# Patient Record
Sex: Female | Born: 1960 | Race: Black or African American | Hispanic: No | Marital: Married | Smoking: Never smoker
Health system: Southern US, Community
[De-identification: ages and names within clinical notes are randomized; demographics above are authoritative.]

## PROBLEM LIST (undated history)

## (undated) DIAGNOSIS — E7800 Pure hypercholesterolemia, unspecified: Secondary | ICD-10-CM

## (undated) DIAGNOSIS — H409 Unspecified glaucoma: Secondary | ICD-10-CM

## (undated) DIAGNOSIS — I10 Essential (primary) hypertension: Secondary | ICD-10-CM

## (undated) HISTORY — PX: ABDOMINAL HYSTERECTOMY: SHX81

## (undated) HISTORY — PX: BREAST REDUCTION SURGERY: SHX8

---

## 1997-09-18 ENCOUNTER — Other Ambulatory Visit: Admission: RE | Admit: 1997-09-18 | Discharge: 1997-09-18 | Payer: Self-pay | Admitting: *Deleted

## 1998-11-29 ENCOUNTER — Other Ambulatory Visit: Admission: RE | Admit: 1998-11-29 | Discharge: 1998-11-29 | Payer: Self-pay | Admitting: *Deleted

## 1999-12-24 ENCOUNTER — Other Ambulatory Visit: Admission: RE | Admit: 1999-12-24 | Discharge: 1999-12-24 | Payer: Self-pay | Admitting: *Deleted

## 2001-01-06 ENCOUNTER — Other Ambulatory Visit: Admission: RE | Admit: 2001-01-06 | Discharge: 2001-01-06 | Payer: Self-pay | Admitting: *Deleted

## 2002-01-17 ENCOUNTER — Other Ambulatory Visit: Admission: RE | Admit: 2002-01-17 | Discharge: 2002-01-17 | Payer: Self-pay | Admitting: *Deleted

## 2002-12-14 ENCOUNTER — Other Ambulatory Visit: Admission: RE | Admit: 2002-12-14 | Discharge: 2002-12-14 | Payer: Self-pay | Admitting: *Deleted

## 2005-12-21 ENCOUNTER — Inpatient Hospital Stay (HOSPITAL_COMMUNITY): Admission: AD | Admit: 2005-12-21 | Discharge: 2005-12-21 | Payer: Self-pay | Admitting: *Deleted

## 2006-01-18 ENCOUNTER — Inpatient Hospital Stay (HOSPITAL_COMMUNITY): Admission: AD | Admit: 2006-01-18 | Discharge: 2006-01-20 | Payer: Self-pay | Admitting: Obstetrics and Gynecology

## 2006-01-18 ENCOUNTER — Encounter (INDEPENDENT_AMBULATORY_CARE_PROVIDER_SITE_OTHER): Payer: Self-pay | Admitting: Specialist

## 2006-01-18 ENCOUNTER — Encounter (INDEPENDENT_AMBULATORY_CARE_PROVIDER_SITE_OTHER): Payer: Self-pay | Admitting: *Deleted

## 2009-10-07 ENCOUNTER — Emergency Department (HOSPITAL_BASED_OUTPATIENT_CLINIC_OR_DEPARTMENT_OTHER): Admission: EM | Admit: 2009-10-07 | Discharge: 2009-10-07 | Payer: Self-pay | Admitting: Emergency Medicine

## 2009-10-07 ENCOUNTER — Ambulatory Visit: Payer: Self-pay | Admitting: Radiology

## 2010-09-05 NOTE — H&P (Signed)
NAMEJENELLE, [name]            ACCOUNT NO.:  [id number]   MEDICAL RECORD NO.:  [id number]          PATIENT TYPE:  AMB   LOCATION:  SDC                           FACILITY:  [facility]   PHYSICIAN:  Pershing Cox, M.D.DATE OF BIRTH:  Sep 29, 1960   DATE OF ADMISSION:  01/18/2006  DATE OF DISCHARGE:                                HISTORY & PHYSICAL   ADMISSION DIAGNOSES:  1. Urinary retention.  2. Degenerating uterine myoma.  3. Abnormal ovary   HISTORY OF PRESENT ILLNESS:  [name] is a 50 year old married black female,  gravida 2, para 2, with a long history of myomatous uterus.  We have  followed her for some time with sonograms because her ovaries are impossible  to discern on pelvic examination because of her uterine myomas.  Recently  she has begun to have difficulty with urination, especially having  difficulty starting a urinary stream.  She was seen in our office for  urinary tract infection in late July, and at that time I told her that her  uterus could be a problem with urination and that if she had difficulty to  present to the emergency room.  In August, she actually presented to the  emergency room at [facility] because she was unable to void.  At  that time she had a CT scan which showed myomas which were suggested to be  degenerating.  She was seen later in our office and taught to self  catheterize, and a decision was made to plan a surgical excision.  She has  been planning that excision, but most recently called [place] and said that she  needed to accelerate that plan because she was now having a really difficult  time catheterizing herself and emptying her bladder.  She was seen today in  the office by Dr. Maxie Better, who is going to be the admitting  surgeon for this operation, and examination was performed.  She had a  sonogram performed in August which showed an abnormal ovary.  The sonogram  was suggestive of abnormal blood flow to this solid  ovarian mass measuring  about 4 x 2 cm.  Today this mass seems smaller, and the blood flow was not  so remarkable.  It is our plan, however, to remove this ovarian mass at the  time of this surgical procedure.  The patient's uterine myomas are  approximately 12 cm in size.  The volume of the uterus is about 600 mL.   PAST MEDICAL HISTORY:  Allergy to PENICILLIN.   SERIOUS MEDICAL ILLNESSES:  1. History of heart murmur.  2. Status post tubal ligation.  3. Status post breast reduction.  4. Status post cesarean section.   SOCIAL HISTORY:  The patient is a nonsmoker.  She is married.  She does not  exercise but does supplement her calcium.   FAMILY HISTORY:  The patient's father died at 42 of renal failure.  He was  having dialysis.  He had a history of heart valve disease and hypertension.  The patient's cousin has a history of breast cancer.   PREGNANCY:  The patient  is gravida 2, para 2.  She has had a tubal ligation.   REVIEW OF SYSTEMS:  Other than urinary retention, there is no specific  complaint.  She has had no specific weight gain, fever.  She has no problem  with breast pain.  Gastrointestinal symptoms are not present.   PHYSICAL EXAMINATION:  VITAL SIGNS:  Blood pressure 112/84, pulse 78.  Height 5 feet 5 inches.  Weight is not recorded.  HEENT: Normocephalic, anicteric, EOMI, PERRL.  GENERAL: The patient is well-nourished and well-developed.  She is a normal  posture, normal gait, normal hydration.  SKIN: No noted skin lesions.  There are skin scars over the site of her  cesarean section and also over her breast from her breast reduction.  LUNGS: The lungs are clear.  CARDIOVASCULAR: Exam shows a harsh cardiac murmur.  ABDOMEN: Shows a palpation of the fibroids rising out of the pelvis.  Liver  and spleen are normal to palpation.  PELVIC:  Normal external genitalia.  Urethra is displaced anteriorly.  The  speculum shows the cervix to be up high behind the symphysis.   The uterus is  markedly retroflexed, and this is probably what is causing the urinary  retention.  Adnexa cannot be assessed on bimanual examination. Rectovaginal  examination shows a markedly retroverted uterus but no evidence of cul-de-  sac nodularity.   ASSESSMENT:  1. Degenerating uterine myoma causing marked retroflexion of the uterus      and urinary retention.  2. Abnormal ovary. Rule out ovarian malignancy.  3. Cardiac murmur.   PLAN:  And the patient will receive ampicillin and gentamicin for antibiotic  prophylaxis.  She will undergo bowel prep in preparation for this surgery.  Exploratory laparotomy will be performed.  Frozen section will be performed  from the abnormal ovary, and hysterectomy will be completed.  If ovarian  cancer is found, a staging procedure will be completed.      Pershing Cox, M.D.  Electronically Signed     MAJ/MEDQ  D:  01/15/2006  T:  01/17/2006  Job:  [id number]

## 2010-09-05 NOTE — Op Note (Signed)
[name], LEBON            ACCOUNT NO.:  [id number]   MEDICAL RECORD NO.:  [id number]          PATIENT TYPE:  INP   LOCATION:  9318                          FACILITY:  [facility]   PHYSICIAN:  Sheronette Cousins, M.D.DATE OF BIRTH:  1961-04-04   DATE OF PROCEDURE:  01/18/2006  DATE OF DISCHARGE:                                 OPERATIVE REPORT   PREOPERATIVE DIAGNOSIS:  Urinary retention, retroverted fibroid uterus,  right complex ovarian cyst.   PROCEDURE:  Exploratory laparotomy, peritoneal washings, total abdominal  hysterectomy, right salpingo-oophorectomy with frozen section.   POSTOPERATIVE DIAGNOSIS:  Hemorrhagic right ovarian cyst, urinary retention,  retroverted fibroid uterus.   ANESTHESIA:  General.   SURGEON:  Maxie Better, M.D.   ASSISTANT:  Pershing Cox, M.D.   DESCRIPTION OF PROCEDURE:  Under adequate general anesthesia, the patient  was placed in the supine position.  Examination under anesthesia revealed a  14-weeks size large fibroid retroverted uterus. The patient was sterilely  prepped and draped in the usual fashion.  An indwelling Foley catheter was  sterilely placed.  10 mL of 0.25% Marcaine was injected along the previous  Pfannenstiel skin incision.  The Pfannenstiel skin incision was then made,  carried down to the rectus fascia.  The rectus fascia was opened  transversely.  The rectus fascia was then bluntly and sharply dissected off  the rectus muscle in a superior and inferior fashion.  The rectus muscle was  already partially separated.  The parietal peritoneum was opened sharply and  extended. On entering the abdominal cavity, it was then noted that the  omentum was adherent focally on the anterior abdominal wall.  Careful  dissection resulted in extension of the parietal peritoneum superiorly. 500  mL of normal saline was instilled in the abdomen, 400 mL was suctioned for  peritoneal washings. The parietal peritoneum was then  further extended.  The  omental adhesions were then taken down carefully. The upper abdomen was  explored, a normal liver edge was palpable, normal kidneys.  The appendix  was noted to be normal, there is fat at the end of the appendix. The uterus  was noted to have a large posterior fibroid, about 8 cm, which resulted in  the uterus being severely retroverted.  The left ovary and tube appeared  normal.  The right ovary had about a 2.5 cm mass that appeared to be blood  filled that was on the surface of the ovary. The right tube was normal.  Prior evidence of tubal ligation was noted bilaterally. The right round  ligament was then suture ligated x2, the intervening segment was opened  using cautery.  The anterior cul-de-sac was then opened transversely with  the bladder reflection being displaced inferiorly.  The right  retroperitoneal space was then opened. The retroperitoneal dissection was  performed.  The right ureter was noted to be visibly peristalsing and  normal.  The right ovarian vessels/infundibulopelvic ligament was doubly  clamped, cut, free tied proximally with the 0 Vicryl x2. The right utero-  ovarian ligament was then clamped and the right tube and ovary were then  severed from  their attachment and sent off for frozen section.  Attention  was returned to the uterus.  The uterus was lifted out of the pelvis and  exteriorized.  The left broad ligament was doubly suture ligated and the  intervening segment opened with cautery.  The anterior leaf of the broad  ligament was opened to further open the anterior cul-de-sac and the  vesicouterine peritoneum. Some adhesions on the left anterior abdominal wall  at the lower level of the lower uterine segment on the left was then lysed.  The posterior leaf of the broad ligament on the left was then opened. The  left utero-ovarian ligament was then doubly clamped, cut, and free tied with  0 Vicryl and suture ligated.  There was a  bleeder that opened up prior to  the ovarian ligament being clamped and cut, and this was carefully clamped  and suture ligated. The uterine vessels were then bilaterally skeletonized,  doubly clamped, cut and suture ligated on the right. On the left, was  clamped, cut, suture ligated and clamped distally, cut and suture ligated  with 0 Vicryl.  The bladder was then further sharply dissected off lower  uterine segment to facilitate the surgery.  The fundus of the uterus was  truncated off from the cervical portion, the bowels were then popped  upwardly, the self-retaining Balfour retractor was then placed. The  truncated cervix was then grasped. The cardinal ligaments was then  bilaterally clamped, cut, suture ligated with 0 Vicryl.  The was no  definition of the uterosacral ligaments noted posteriorly.  They were not  well developed. The cervicovaginal junction was ultimately reached. The  clamp was placed across the cervicovaginal junction on the left, it was then  cut, at which time the cervix was noted to have been severed from its  attachment.  Right angle scissors was then used to circumferentially cut the  cervix from its vaginal attachments. The right vaginal cuff, angled figure-  of-eight suture was then placed. The cervix was then reefed with 0 Vicryl  taking care not to involve the bladder anteriorly and then the cervix was  closed with interrupted 0 Vicryl figure-of-eight sutures. There was a  bleeding site on the right angle which was then hemostasis 3-0 Monocryl  suture. Also, there was bleeding from the peritoneal edges of the left ovary  the retroperitoneal space on the left which was then clamped and free tied  with 3-0 Monocryl suture.  There was a bleeder near the left ovarian pedicle  which was carefully clamped and again suture ligated with 0 Vicryl. The left  ureter was noted to be deep in the pelvis. The abdomen was then copiously irrigated, suctioned, reinspected for  bleeding, and good hemostasis was  noted. The left ovary and tube were not suspended to the left broad  ligament. The packings were removed.  The self-retaining retractor was  removed.  The abdomen was again irrigated and suctioned.  Good hemostasis  noted. At that point, the parietal peritoneum was not closed.  The rectus  fascia was closed with 0 Vicryl x2 after the under surface was inspected for  bleeders.  The subcuticular area was irrigated, small bleeders cauterized,  and 2-0 plain interrupted sutures were placed and the skin approximated  using Ethicon staples.  The specimen was peritoneal washings sent for  cytology, uterus with cervix, right fallopian tube and ovary.  Estimated  blood loss was 150 mL, urine output was 725 mL clear yellow urine,  intraoperative  fluid was 2 liters.  Sponge and instrument counts x2 was  correct.  Complication were none.  Frozen section revealed a hemorrhagic  cyst.  The patient tolerated the procedure well and was transferred to the  recovery room in stable condition.      Maxie Better, M.D.  Electronically Signed     [place]/MEDQ  D:  01/18/2006  T:  01/19/2006  Job:  [id number]

## 2010-09-05 NOTE — H&P (Signed)
[name], [name]            ACCOUNT NO.:  [id number]   MEDICAL RECORD NO.:  [id number]          PATIENT TYPE:  MAT   LOCATION:  MATC                          FACILITY:  [facility]   PHYSICIAN:  [place] B. Earlene Plater, M.D.  DATE OF BIRTH:  1960-08-22   DATE OF ADMISSION:  12/21/2005  DATE OF DISCHARGE:                                HISTORY & PHYSICAL   CHIEF COMPLAINT:  Urinary retention.   HISTORY OF PRESENT ILLNESS:  A 50 year old African-American female, gravida  2, para 2 with history of uterine fibroids and difficulty voiding. She  states most mornings when she arises she has extreme difficulty voiding.  Today it was severe. She was completely unable to void. Typically as she  goes through the day this improves, but at night it is an issue once again.  She has history of fibroids and plans for hysterectomy in the near future  with Dr. Carey Bullocks.   PAST MEDICAL HISTORY:  Heart murmur.   PAST SURGICAL HISTORY:  C-section, laparoscopy for Fallopian tube cyst,  breast reduction.   REVIEW OF SYSTEMS:  Otherwise, negative.   MEDICATIONS:  None.   ALLERGIES:  None.   PAST OBSTETRICAL HISTORY:  Gravida 2, para 2, previous C-section.   PHYSICAL EXAMINATION:  VITAL SIGNS:  Temperature 97.9, pulse 100,  respirations 20, blood pressure 172/110.  GENERAL:  The patient is alert and oriented and in no acute distress, moving  extremities well. NEUROLOGICAL:  Nonfocal.  ABDOMEN:  No mass palpable, liver and spleen are normal.  PELVIC:  Normal external genitalia. Vagina normal. Cervix is touching  against the mid urethra just behind the pubic symphysis. Uterus is  retroverted. Does feel enlarged with history of fibroids and adnexal masses.   ASSESSMENT:  Intermittent urinary retention apparently due to mechanical  obstruction from mild prolapse and uterine fibroids.   PLAN:  The patient will be instructed in intermittent self cath, and urine  culture will be sent today. The patient is  instructed to follow up in the  office as previously directed for preop visit.      [name] B. Earlene Plater, M.D.  Electronically Signed     WBD/MEDQ  D:  12/21/2005  T:  12/21/2005  Job:  [id number]

## 2011-07-22 ENCOUNTER — Emergency Department (HOSPITAL_BASED_OUTPATIENT_CLINIC_OR_DEPARTMENT_OTHER)
Admission: EM | Admit: 2011-07-22 | Discharge: 2011-07-22 | Disposition: A | Discharge status: Home / Self Care | Payer: BC Managed Care – PPO | Attending: Emergency Medicine | Admitting: Emergency Medicine

## 2011-07-22 ENCOUNTER — Encounter (HOSPITAL_BASED_OUTPATIENT_CLINIC_OR_DEPARTMENT_OTHER): Payer: Self-pay | Admitting: *Deleted

## 2011-07-22 DIAGNOSIS — J069 Acute upper respiratory infection, unspecified: Secondary | ICD-10-CM | POA: Insufficient documentation

## 2011-07-22 DIAGNOSIS — Z880 Allergy status to penicillin: Secondary | ICD-10-CM | POA: Insufficient documentation

## 2011-07-22 DIAGNOSIS — I10 Essential (primary) hypertension: Secondary | ICD-10-CM | POA: Insufficient documentation

## 2011-07-22 HISTORY — DX: Essential (primary) hypertension: I10

## 2011-07-22 MED ORDER — HYDROCODONE-HOMATROPINE 5-1.5 MG/5ML PO SYRP: 5.0000 mL | ORAL_SOLUTION | Freq: Three times a day (TID) | ORAL | Status: AC | PRN

## 2011-07-22 NOTE — ED Provider Notes (Signed)
History     CSN: [id number]  Arrival date & time 07/22/11  1421   First MD Initiated Contact with Patient 07/22/11 1454      Chief Complaint  Patient presents with  . URI  . Headache     Patient is a 51 y.o. female presenting with URI and headaches. The history is provided by the patient.  URI The primary symptoms include fatigue, headaches, cough, nausea and myalgias. Primary symptoms do not include ear pain, sore throat, abdominal pain or vomiting. The current episode started 2 days ago. This is a new problem. The problem has been gradually worsening.  The onset of the illness is associated with exposure to sick contacts.  Headache  Associated symptoms include nausea. Pertinent negatives include no vomiting.  Pt reports cough/congestion for past 2 days She also reports myalgias No recorded fever No SOB/CP/Syncope No vomiting/diarrhea She reports she has HA mostly with coughing Reports HA improved at this time  Past Medical History  Diagnosis Date  . Hypertension     Past Surgical History  Procedure Date  . Abdominal hysterectomy   . Cesarean section     History reviewed. No pertinent family history.  History  Substance Use Topics  . Smoking status: Never Smoker   . Smokeless tobacco: Not on file  . Alcohol Use: No    OB History    Grav Para Term Preterm Abortions TAB SAB Ect Mult Living                  Review of Systems  Constitutional: Positive for fatigue.  HENT: Negative for ear pain and sore throat.   Respiratory: Positive for cough.   Gastrointestinal: Positive for nausea. Negative for vomiting and abdominal pain.  Musculoskeletal: Positive for myalgias.  Neurological: Positive for headaches.    Allergies  Penicillins  Home Medications   Current Outpatient Rx  Name Route Sig Dispense Refill  . HYDROCHLOROTHIAZIDE 25 MG PO TABS Oral Take 25 mg by mouth daily.    Marland Kitchen HYDROCODONE-HOMATROPINE 5-1.5 MG/5ML PO SYRP Oral Take 5 mLs by mouth every  8 (eight) hours as needed for cough. 120 mL 0    BP 188/109  Pulse 101  Temp(Src) 99.4 F (37.4 C) (Oral)  Resp 20  Ht 5\' 6"  (1.676 m)  Wt 175 lb (79.379 kg)  BMI 28.25 kg/m2  SpO2 100%  Physical Exam CONSTITUTIONAL: Well developed/well nourished HEAD AND FACE: Normocephalic/atraumatic EYES: EOMI/PERRL ENMT: Mucous membranes moist, nasal congestion NECK: supple no meningeal signs SPINE:entire spine nontender CV: murmur noted (chronic per patient) LUNGS: Lungs are clear to auscultation bilaterally, no apparent distress ABDOMEN: soft, nontender, no rebound or guarding GU:no cva tenderness NEURO: Pt is awake/alert, moves all extremitiesx4 EXTREMITIES: pulses normal, full ROM SKIN: warm, color normal PSYCH: no abnormalities of mood noted  ED Course  Procedures     1. Hypertension   2. URI (upper respiratory infection)    Pt well appearing watching TV She has cough/congestion but lung sounds clear Pt has murmur but she reports this is chronic per patient and it is not harsh/loud in nature  The patient appears reasonably screened and/or stabilized for discharge and I doubt any other medical condition or other [facility] requiring further screening, evaluation, or treatment in the ED at this time prior to discharge.    MDM  Nursing notes reviewed and considered in documentation         Joya Gaskins, MD 07/22/11 1520

## 2011-07-22 NOTE — Discharge Instructions (Signed)
Antibiotic Nonuse  Your caregiver felt that the infection or problem was not one that would be helped with an antibiotic. Infections may be caused by viruses or bacteria. Only a caregiver can tell which one of these is the likely cause of an illness. A cold is the most common cause of infection in both adults and children. A cold is a virus. Antibiotic treatment will have no effect on a viral infection. Viruses can lead to many lost days of work caring for sick children and many missed days of school. Children may catch as many as 10 "colds" or "flus" per year during which they can be tearful, cranky, and uncomfortable. The goal of treating a virus is aimed at keeping the ill person comfortable. Antibiotics are medications used to help the body fight bacterial infections. There are relatively few types of bacteria that cause infections but there are hundreds of viruses. While both viruses and bacteria cause infection they are very different types of germs. A viral infection will typically go away by itself within 7 to 10 days. Bacterial infections may spread or get worse without antibiotic treatment. Examples of bacterial infections are:  Sore throats (like strep throat or tonsillitis).   Infection in the lung (pneumonia).   Ear and skin infections.  Examples of viral infections are:  Colds or flus.   Most coughs and bronchitis.   Sore throats not caused by Strep.   Runny noses.  It is often best not to take an antibiotic when a viral infection is the cause of the problem. Antibiotics can kill off the helpful bacteria that we have inside our body and allow harmful bacteria to start growing. Antibiotics can cause side effects such as allergies, nausea, and diarrhea without helping to improve the symptoms of the viral infection. Additionally, repeated uses of antibiotics can cause bacteria inside of our body to become resistant. That resistance can be passed onto harmful bacterial. The next time  you have an infection it may be harder to treat if antibiotics are used when they are not needed. Not treating with antibiotics allows our own immune system to develop and take care of infections more efficiently. Also, antibiotics will work better for us when they are prescribed for bacterial infections. Treatments for a child that is ill may include:  Give extra fluids throughout the day to stay hydrated.   Get plenty of rest.   Only give your child over-the-counter or prescription medicines for pain, discomfort, or fever as directed by your caregiver.   The use of a cool mist humidifier may help stuffy noses.   Cold medications if suggested by your caregiver.  Your caregiver may decide to start you on an antibiotic if:  The problem you were seen for today continues for a longer length of time than expected.   You develop a secondary bacterial infection.  SEEK MEDICAL CARE IF:  Fever lasts longer than 5 days.   Symptoms continue to get worse after 5 to 7 days or become severe.   Difficulty in breathing develops.   Signs of dehydration develop (poor drinking, rare urinating, dark colored urine).   Changes in behavior or worsening tiredness (listlessness or lethargy).  Document Released: 06/15/2001 Document Revised: 03/26/2011 Document Reviewed: 12/12/2008 Exit[employer] Patient Information 2012 [employer].Arterial Hypertension Arterial hypertension (high blood pressure) is a condition of elevated pressure in your blood vessels. Hypertension over a long period of time is a risk factor for strokes, heart attacks, and heart failure. It is also   the leading cause of kidney (renal) failure.  CAUSES   In Adults -- Over 90% of all hypertension has no known cause. This is called essential or primary hypertension. In the other 10% of people with hypertension, the increase in blood pressure is caused by another disorder. This is called secondary hypertension. Important causes of secondary  hypertension are:   Heavy alcohol use.   Obstructive sleep apnea.   Hyperaldosterosim (Conn's syndrome).   Steroid use.   Chronic kidney failure.   Hyperparathyroidism.   Medications.   Renal artery stenosis.   Pheochromocytoma.   Cushing's disease.   Coarctation of the aorta.   Scleroderma renal crisis.   Licorice (in excessive amounts).   Drugs (cocaine, methamphetamine).  Your caregiver can explain any items above that apply to you.  In Children -- Secondary hypertension is more common and should always be considered.   Pregnancy -- Few women of childbearing age have high blood pressure. However, up to 10% of them develop hypertension of pregnancy. Generally, this will not harm the woman. It may be a sign of 3 complications of pregnancy: preeclampsia, HELLP syndrome, and eclampsia. Follow up and control with medication is necessary.  SYMPTOMS   This condition normally does not produce any noticeable symptoms. It is usually found during a routine exam.   Malignant hypertension is a late problem of high blood pressure. It may have the following symptoms:   Headaches.   Blurred vision.   End-organ damage (this means your kidneys, heart, lungs, and other organs are being damaged).   Stressful situations can increase the blood pressure. If a person with normal blood pressure has their blood pressure go up while being seen by their caregiver, this is often termed "white coat hypertension." Its importance is not known. It may be related with eventually developing hypertension or complications of hypertension.   Hypertension is often confused with mental tension, stress, and anxiety.  DIAGNOSIS  The diagnosis is made by 3 separate blood pressure measurements. They are taken at least 1 week apart from each other. If there is organ damage from hypertension, the diagnosis may be made without repeat measurements. Hypertension is usually identified by having blood pressure  readings:  Above 140/90 mmHg measured in both arms, at 3 separate times, over a couple weeks.   Over 130/80 mmHg should be considered a risk factor and may require treatment in patients with diabetes.  Blood pressure readings over 120/80 mmHg are called "pre-hypertension" even in non-diabetic patients. To get a true blood pressure measurement, use the following guidelines. Be aware of the factors that can alter blood pressure readings.  Take measurements at least 1 hour after caffeine.   Take measurements 30 minutes after smoking and without any stress. This is another reason to quit smoking - it raises your blood pressure.   Use a proper cuff size. Ask your caregiver if you are not sure about your cuff size.   Most home blood pressure cuffs are automatic. They will measure systolic and diastolic pressures. The systolic pressure is the pressure reading at the start of sounds. Diastolic pressure is the pressure at which the sounds disappear. If you are elderly, measure pressures in multiple postures. Try sitting, lying or standing.   Sit at rest for a minimum of 5 minutes before taking measurements.   You should not be on any medications like decongestants. These are found in many cold medications.   Record your blood pressure readings and review them with your caregiver.    If you have hypertension:  Your caregiver may do tests to be sure you do not have secondary hypertension (see "causes" above).   Your caregiver may also look for signs of metabolic syndrome. This is also called Syndrome X or Insulin Resistance Syndrome. You may have this syndrome if you have type 2 diabetes, abdominal obesity, and abnormal blood lipids in addition to hypertension.   Your caregiver will take your medical and family history and perform a physical exam.   Diagnostic tests may include blood tests (for glucose, cholesterol, potassium, and kidney function), a urinalysis, or an EKG. Other tests may also be  necessary depending on your condition.  PREVENTION  There are important lifestyle issues that you can adopt to reduce your chance of developing hypertension:  Maintain a normal weight.   Limit the amount of salt (sodium) in your diet.   Exercise often.   Limit alcohol intake.   Get enough potassium in your diet. Discuss specific advice with your caregiver.   Follow a DASH diet (dietary approaches to stop hypertension). This diet is rich in fruits, vegetables, and low-fat dairy products, and avoids certain fats.  PROGNOSIS  Essential hypertension cannot be cured. Lifestyle changes and medical treatment can lower blood pressure and reduce complications. The prognosis of secondary hypertension depends on the underlying cause. Many people whose hypertension is controlled with medicine or lifestyle changes can live a normal, healthy life.  RISKS AND COMPLICATIONS  While high blood pressure alone is not an illness, it often requires treatment due to its short- and long-term effects on many organs. Hypertension increases your risk for:  CVAs or strokes (cerebrovascular accident).   Heart failure due to chronically high blood pressure (hypertensive cardiomyopathy).   Heart attack (myocardial infarction).   Damage to the retina (hypertensive retinopathy).   Kidney failure (hypertensive nephropathy).  Your caregiver can explain list items above that apply to you. Treatment of hypertension can significantly reduce the risk of complications. TREATMENT   For overweight patients, weight loss and regular exercise are recommended. Physical fitness lowers blood pressure.   Mild hypertension is usually treated with diet and exercise. A diet rich in fruits and vegetables, fat-free dairy products, and foods low in fat and salt (sodium) can help lower blood pressure. Decreasing salt intake decreases blood pressure in a 1/3 of people.   Stop smoking if you are a smoker.  The steps above are highly  effective in reducing blood pressure. While these actions are easy to suggest, they are difficult to achieve. Most patients with moderate or severe hypertension end up requiring medications to bring their blood pressure down to a normal level. There are several classes of medications for treatment. Blood pressure pills (antihypertensives) will lower blood pressure by their different actions. Lowering the blood pressure by 10 mmHg may decrease the risk of complications by as much as 25%. The goal of treatment is effective blood pressure control. This will reduce your risk for complications. Your caregiver will help you determine the best treatment for you according to your lifestyle. What is excellent treatment for one person, may not be for you. HOME CARE INSTRUCTIONS   Do not smoke.   Follow the lifestyle changes outlined in the "Prevention" section.   If you are on medications, follow the directions carefully. Blood pressure medications must be taken as prescribed. Skipping doses reduces their benefit. It also puts you at risk for problems.   Follow up with your caregiver, as directed.   If you are asked   to monitor your blood pressure at home, follow the guidelines in the "Diagnosis" section above.  SEEK MEDICAL CARE IF:   You think you are having medication side effects.   You have recurrent headaches or lightheadedness.   You have swelling in your ankles.   You have trouble with your vision.  SEEK IMMEDIATE MEDICAL CARE IF:   You have sudden onset of chest pain or pressure, difficulty breathing, or other symptoms of a heart attack.   You have a severe headache.   You have symptoms of a stroke (such as sudden weakness, difficulty speaking, difficulty walking).  MAKE SURE YOU:   Understand these instructions.   Will watch your condition.   Will get help right away if you are not doing well or get worse.  Document Released: 04/06/2005 Document Revised: 03/26/2011 Document  Reviewed: 11/04/2006 Exit[employer] Patient Information 2012 [employer]. 

## 2011-07-22 NOTE — ED Notes (Signed)
Pt states cold symptoms x 2 days

## 2011-07-22 NOTE — ED Notes (Signed)
Pt c/o of headache since Monday- states "it comes and goes"

## 2012-04-18 ENCOUNTER — Emergency Department (HOSPITAL_BASED_OUTPATIENT_CLINIC_OR_DEPARTMENT_OTHER): Payer: BC Managed Care – PPO

## 2012-04-18 ENCOUNTER — Emergency Department (HOSPITAL_BASED_OUTPATIENT_CLINIC_OR_DEPARTMENT_OTHER)
Admission: EM | Admit: 2012-04-18 | Discharge: 2012-04-18 | Disposition: A | Discharge status: Home / Self Care | Payer: BC Managed Care – PPO | Attending: Emergency Medicine | Admitting: Emergency Medicine

## 2012-04-18 ENCOUNTER — Encounter (HOSPITAL_BASED_OUTPATIENT_CLINIC_OR_DEPARTMENT_OTHER): Payer: Self-pay

## 2012-04-18 DIAGNOSIS — M25462 Effusion, left knee: Secondary | ICD-10-CM

## 2012-04-18 DIAGNOSIS — M25469 Effusion, unspecified knee: Secondary | ICD-10-CM | POA: Insufficient documentation

## 2012-04-18 DIAGNOSIS — I10 Essential (primary) hypertension: Secondary | ICD-10-CM | POA: Insufficient documentation

## 2012-04-18 MED ORDER — IBUPROFEN 400 MG PO TABS: 400.0000 mg | ORAL_TABLET | Freq: Four times a day (QID) | ORAL | Status: DC | PRN

## 2012-04-18 NOTE — ED Notes (Signed)
C/o swelling, pain to left knee and calf since 12/20-denies injury

## 2012-04-18 NOTE — ED Notes (Signed)
Pt sts she has crutches at home so she refused crutches here.

## 2012-04-18 NOTE — ED Provider Notes (Signed)
History  This chart was scribed for Tobin Chad, MD by Manuela Schwartz, ED scribe. This patient was seen in room MH11/MH11 and the patient's care was started at 1903.   CSN: [id number]  Arrival date & time 04/18/12  1903   First MD Initiated Contact with Patient 04/18/12 2141      Chief Complaint  Patient presents with  . Leg Pain   Patient is a 51 y.o. female presenting with leg pain. The history is provided by the patient. No language interpreter was used.  Leg Pain  The incident occurred more than 1 week ago. The incident occurred at home. There was no injury mechanism. The pain is present in the left knee and left leg. The quality of the pain is described as aching. The pain is mild. The pain has been constant since onset. Pertinent negatives include no numbness and no tingling. She reports no foreign bodies present. The symptoms are aggravated by bearing weight. She has tried nothing for the symptoms.   [name] is a 51 y.o. female who presents to the Emergency Department complaining of constant gradually worsening left knee/leg swelling/pain since 12/20. She denies any recent trauma or previous problems with her left knee. 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 She denies fever, rash, dysuria, discharge, rash, recent travels. She denies hx of gout.   Past Medical History  Diagnosis Date  . Hypertension     Past Surgical History  Procedure Date  . Abdominal hysterectomy   . Cesarean section     No family history on file.  History  Substance Use Topics  . Smoking status: Never Smoker   . Smokeless tobacco: Not on file  . Alcohol Use: No    OB History    Grav Para Term Preterm Abortions TAB SAB Ect Mult Living                  Review of Systems  Constitutional: Negative for fever and chills.  Respiratory: Negative for shortness of breath.   Gastrointestinal: Negative for nausea and vomiting.  Musculoskeletal: Positive for joint  swelling (left knee joint swelling with mild pain).  Neurological: Negative for tingling, weakness and numbness.  All other systems reviewed and are negative.    Allergies  Penicillins  Home Medications   Current Outpatient Rx  Name  Route  Sig  Dispense  Refill  . HYDROCHLOROTHIAZIDE 25 MG PO TABS   Oral   Take 25 mg by mouth daily.           Triage Vitals: BP 188/95  Pulse 82  Temp 98.6 F (37 C) (Oral)  Resp 16  SpO2 100%  Physical Exam  Nursing note and vitals reviewed. Constitutional: She is oriented to person, place, and time. She appears well-developed and well-nourished. No distress.  HENT:  Head: Normocephalic and atraumatic.  Right Ear: External ear normal.  Left Ear: External ear normal.  Nose: Nose normal.  Mouth/Throat: No oropharyngeal exudate.  Eyes: Conjunctivae normal and EOM are normal. Pupils are equal, round, and reactive to light. Right eye exhibits no discharge. Left eye exhibits no discharge. No scleral icterus.  Neck: Neck supple. No JVD present. No tracheal deviation present.  Cardiovascular: Normal rate, regular rhythm, normal heart sounds and intact distal pulses.  Exam reveals no gallop and no friction rub.   No murmur heard. Pulmonary/Chest: Effort normal and breath sounds normal. No stridor. No respiratory distress. She has no wheezes. She has  no rales. She exhibits no tenderness.  Abdominal: Soft. She exhibits no distension and no mass. There is no tenderness. There is no rebound and no guarding.  Musculoskeletal: Normal range of motion. She exhibits tenderness. She exhibits no edema.       Right knee: Normal.       Left knee: She exhibits swelling and effusion. She exhibits normal range of motion, no ecchymosis, no deformity, no laceration, no erythema, normal alignment, no LCL laxity, normal patellar mobility, no bony tenderness, normal meniscus and no MCL laxity. tenderness found. Medial joint line and MCL tenderness noted. No lateral  joint line, no LCL and no patellar tendon tenderness noted.       Negative anterior and posterior drawer  Neurological: She is alert and oriented to person, place, and time. No cranial nerve deficit.  Skin: Skin is warm and dry. No rash noted. No erythema. No pallor.  Psychiatric: She has a normal mood and affect. Her behavior is normal.    ED Course  Procedures (including critical care time) DIAGNOSTIC STUDIES: Oxygen Saturation is 100% on room air, normal by my interpretation.     Labs Reviewed - No data to display No results found.   No diagnosis found.    MDM  Pt presents with atraumatic left knee swelling.  She appears nontoxic, note elevated BP, NAD.  She has no risk factors for a septic arthropathy and is able to tolerate complete ROM with minimal discomfort.  She has tenderness with palpation along the medical joint line.  There is no instability of the joint.  She is not exquisitely tender to suggest an acute gouty arthropathy.  Will place in a knee immobilizer to be used for comfort.  Will start a short course of an antiinflammatory and instruct close follow-up with an orthopedic specialist.    I personally performed the services described in this documentation, which was scribed in my presence. The recorded information has been reviewed and is accurate.           Tobin Chad, MD 04/18/12 [phone]

## 2015-01-17 ENCOUNTER — Emergency Department (HOSPITAL_BASED_OUTPATIENT_CLINIC_OR_DEPARTMENT_OTHER): Payer: Worker's Compensation

## 2015-01-17 ENCOUNTER — Emergency Department (HOSPITAL_BASED_OUTPATIENT_CLINIC_OR_DEPARTMENT_OTHER)
Admission: EM | Admit: 2015-01-17 | Discharge: 2015-01-17 | Disposition: A | Discharge status: Home / Self Care | Payer: Worker's Compensation | Attending: Emergency Medicine | Admitting: Emergency Medicine

## 2015-01-17 ENCOUNTER — Encounter (HOSPITAL_BASED_OUTPATIENT_CLINIC_OR_DEPARTMENT_OTHER): Payer: Self-pay

## 2015-01-17 DIAGNOSIS — Y9389 Activity, other specified: Secondary | ICD-10-CM | POA: Insufficient documentation

## 2015-01-17 DIAGNOSIS — Y9289 Other specified places as the place of occurrence of the external cause: Secondary | ICD-10-CM | POA: Insufficient documentation

## 2015-01-17 DIAGNOSIS — S60221A Contusion of right hand, initial encounter: Secondary | ICD-10-CM | POA: Diagnosis not present

## 2015-01-17 DIAGNOSIS — S6991XA Unspecified injury of right wrist, hand and finger(s), initial encounter: Secondary | ICD-10-CM | POA: Diagnosis present

## 2015-01-17 DIAGNOSIS — E780 Pure hypercholesterolemia: Secondary | ICD-10-CM | POA: Insufficient documentation

## 2015-01-17 DIAGNOSIS — Y990 Civilian activity done for income or pay: Secondary | ICD-10-CM | POA: Diagnosis not present

## 2015-01-17 DIAGNOSIS — Z79899 Other long term (current) drug therapy: Secondary | ICD-10-CM | POA: Diagnosis not present

## 2015-01-17 DIAGNOSIS — W231XXA Caught, crushed, jammed, or pinched between stationary objects, initial encounter: Secondary | ICD-10-CM | POA: Diagnosis not present

## 2015-01-17 DIAGNOSIS — I10 Essential (primary) hypertension: Secondary | ICD-10-CM | POA: Insufficient documentation

## 2015-01-17 DIAGNOSIS — Z880 Allergy status to penicillin: Secondary | ICD-10-CM | POA: Insufficient documentation

## 2015-01-17 HISTORY — DX: Pure hypercholesterolemia, unspecified: E78.00

## 2015-01-17 NOTE — ED Provider Notes (Signed)
CSN: [id number]     Arrival date & time 01/17/15  1412 History   First MD Initiated Contact with Patient 01/17/15 1427     Chief Complaint  Patient presents with  . Hand Injury     (Consider location/radiation/quality/duration/timing/severity/associated sxs/prior Treatment) HPI Comments: Patient presents with complaint of right hand injury. Patient states that yesterday she closed her right hand in a door at work. She had pain and noted worsening swelling this morning to the back of her hand. She has applied ice. No treatments prior to arrival. Patient went to work today without any difficulty. She was instructed to come to the emergency department for evaluation by her job. No other complaints. Onset of symptoms acute. Course is constant. Nothing makes symptoms better or worse.  Patient is a 54 y.o. female presenting with hand injury. The history is provided by the patient.  Hand Injury Associated symptoms: no back pain and no neck pain     Past Medical History  Diagnosis Date  . Hypertension   . High cholesterol    Past Surgical History  Procedure Laterality Date  . Abdominal hysterectomy    . Cesarean section     No family history on file. Social History  Substance Use Topics  . Smoking status: Never Smoker   . Smokeless tobacco: None  . Alcohol Use: No   OB History    No data available     Review of Systems  Constitutional: Negative for activity change.  Musculoskeletal: Positive for joint swelling and arthralgias. Negative for back pain and neck pain.  Skin: Negative for wound.  Neurological: Negative for weakness and numbness.      Allergies  Penicillins  Home Medications   Prior to Admission medications   Medication Sig Start Date End Date Taking? Authorizing Arland Usery  amLODipine (NORVASC) 10 MG tablet Take 10 mg by mouth daily.   Yes Historical Fraidy Mccarrick, MD  rosuvastatin (CRESTOR) 10 MG tablet Take 10 mg by mouth daily.   Yes Historical Zephaniah Lubrano, MD    BP 156/88 mmHg  Pulse 79  Temp(Src) 97.7 F (36.5 C) (Oral)  Resp 18  Ht  (1.676 m)  Wt 173 lb (78.472 kg)  BMI 27.94 kg/m2  SpO2 100% Physical Exam  Constitutional: She appears well-developed and well-nourished.  HENT:  Head: Normocephalic and atraumatic.  Eyes: Pupils are equal, round, and reactive to light.  Neck: Normal range of motion. Neck supple.  Cardiovascular: Exam reveals no decreased pulses.   Pulses:      Radial pulses are 2+ on the right side.  Musculoskeletal: She exhibits tenderness. She exhibits no edema.       Right shoulder: Normal.       Right elbow: Normal.      Right wrist: Normal.       Right hand: She exhibits tenderness and bony tenderness. She exhibits normal range of motion. Normal sensation noted. Normal strength noted.       Hands: Neurological: She is alert. No sensory deficit.  Motor, sensation, and vascular distal to the injury is fully intact.   Skin: Skin is warm and dry.  Psychiatric: She has a normal mood and affect.  Nursing note and vitals reviewed.   ED Course  Procedures (including critical care time) Labs Review Labs Reviewed - No data to display  Imaging Review No results found. I have personally reviewed and evaluated these images and lab results as part of my medical decision-making.   EKG Interpretation None  2:50 PM Patient seen and examined. X-ray reviewed. Pt informed of results.   Vital signs reviewed and are as follows: BP 156/88 mmHg  Pulse 79  Temp(Src) 97.7 F (36.5 C) (Oral)  Resp 18  Ht  (1.676 m)  Wt 173 lb (78.472 kg)  BMI 27.94 kg/m2  SpO2 100%  Patient was counseled on RICE protocol and told to rest injury, use ice for no longer than 15 minutes every hour, compress the area, and elevate above the level of their heart as much as possible to reduce swelling. Questions answered. Patient verbalized understanding.    Patient states she feels like she can work and did so today.  Paperwork completed.   MDM   Final diagnoses:  Hand contusion, right, initial encounter   Contusion of hand. No functional deficit. X-rays negative. Upper extremity is neurovascularly intact. Conservative measures with PCP follow-up as needed indicated at this time.   Renne Crigler, PA-C 01/17/15 1512  Vanetta Mulders, MD 01/18/15 [phone]

## 2015-01-17 NOTE — Discharge Instructions (Signed)
Please read and follow all provided instructions.  Your diagnoses today include:  1. Hand contusion, right, initial encounter    Tests performed today include:  An x-ray of the affected area - does NOT show any broken bones  Vital signs. See below for your results today.   Medications prescribed:   None  Take any prescribed medications only as directed.  Home care instructions:   Follow any educational materials contained in this packet  Follow R.I.C.E. Protocol:  R - rest your injury   I  - use ice on injury without applying directly to skin  C - compress injury with bandage or splint  E - elevate the injury as much as possible  Follow-up instructions: Please follow-up with your primary care provider if you continue to have significant pain in 1 week. In this case you may have a more severe injury that requires further care.   Return instructions:   Please return if your fingers are numb or tingling, appear gray or blue, or you have severe pain (also elevate the arm and loosen splint or wrap if you were given one)  Please return to the Emergency Department if you experience worsening symptoms.   Please return if you have any other emergent concerns.  Additional Information:  Your vital signs today were: BP 156/88 mmHg   Pulse 79   Temp(Src) 97.7 F (36.5 C) (Oral)   Resp 18   Ht  (1.676 m)   Wt 173 lb (78.472 kg)   BMI 27.94 kg/m2   SpO2 100% If your blood pressure (BP) was elevated above 135/85 this visit, please have this repeated by your doctor within one month. --------------

## 2015-01-17 NOTE — ED Notes (Signed)
Slammed right hand in door at work yesterday

## 2015-01-17 NOTE — ED Notes (Signed)
Patient transported to X-ray 

## 2015-05-27 ENCOUNTER — Encounter (HOSPITAL_BASED_OUTPATIENT_CLINIC_OR_DEPARTMENT_OTHER): Payer: Self-pay

## 2015-05-27 ENCOUNTER — Emergency Department (HOSPITAL_BASED_OUTPATIENT_CLINIC_OR_DEPARTMENT_OTHER): Payer: No Typology Code available for payment source

## 2015-05-27 ENCOUNTER — Emergency Department (HOSPITAL_BASED_OUTPATIENT_CLINIC_OR_DEPARTMENT_OTHER)
Admission: EM | Admit: 2015-05-27 | Discharge: 2015-05-27 | Disposition: A | Discharge status: Home / Self Care | Payer: No Typology Code available for payment source | Attending: Emergency Medicine | Admitting: Emergency Medicine

## 2015-05-27 DIAGNOSIS — Y9389 Activity, other specified: Secondary | ICD-10-CM | POA: Insufficient documentation

## 2015-05-27 DIAGNOSIS — Z880 Allergy status to penicillin: Secondary | ICD-10-CM | POA: Diagnosis not present

## 2015-05-27 DIAGNOSIS — Y92410 Unspecified street and highway as the place of occurrence of the external cause: Secondary | ICD-10-CM | POA: Insufficient documentation

## 2015-05-27 DIAGNOSIS — Z041 Encounter for examination and observation following transport accident: Secondary | ICD-10-CM | POA: Insufficient documentation

## 2015-05-27 DIAGNOSIS — E7800 Pure hypercholesterolemia, unspecified: Secondary | ICD-10-CM | POA: Insufficient documentation

## 2015-05-27 DIAGNOSIS — I10 Essential (primary) hypertension: Secondary | ICD-10-CM | POA: Diagnosis not present

## 2015-05-27 DIAGNOSIS — Y998 Other external cause status: Secondary | ICD-10-CM | POA: Insufficient documentation

## 2015-05-27 DIAGNOSIS — Z79899 Other long term (current) drug therapy: Secondary | ICD-10-CM | POA: Diagnosis not present

## 2015-05-27 MED ORDER — CYCLOBENZAPRINE HCL 10 MG PO TABS: 10.0000 mg | ORAL_TABLET | Freq: Three times a day (TID) | ORAL | Status: AC | PRN

## 2015-05-27 MED FILL — CYCLOBENZAPRINE 10 MG TAB: 10 | 10 days supply | Qty: 30 | Fill #0
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
## 2015-05-27 NOTE — ED Notes (Signed)
MD at bedside. 
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
## 2015-05-27 NOTE — ED Notes (Signed)
MD at bedside discussing test results and dispo plan of care. 

## 2015-05-27 NOTE — Discharge Instructions (Signed)
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
## 2017-03-02 ENCOUNTER — Emergency Department (HOSPITAL_BASED_OUTPATIENT_CLINIC_OR_DEPARTMENT_OTHER)
Admission: EM | Admit: 2017-03-02 | Discharge: 2017-03-02 | Disposition: A | Discharge status: Home / Self Care | Payer: BLUE CROSS/BLUE SHIELD | Attending: Physician Assistant | Admitting: Physician Assistant

## 2017-03-02 ENCOUNTER — Other Ambulatory Visit: Payer: Self-pay

## 2017-03-02 ENCOUNTER — Encounter (HOSPITAL_BASED_OUTPATIENT_CLINIC_OR_DEPARTMENT_OTHER): Payer: Self-pay | Admitting: *Deleted

## 2017-03-02 DIAGNOSIS — B9789 Other viral agents as the cause of diseases classified elsewhere: Secondary | ICD-10-CM

## 2017-03-02 DIAGNOSIS — I10 Essential (primary) hypertension: Secondary | ICD-10-CM | POA: Diagnosis not present

## 2017-03-02 DIAGNOSIS — J069 Acute upper respiratory infection, unspecified: Secondary | ICD-10-CM | POA: Diagnosis not present

## 2017-03-02 DIAGNOSIS — Z79899 Other long term (current) drug therapy: Secondary | ICD-10-CM | POA: Diagnosis not present

## 2017-03-02 DIAGNOSIS — R0981 Nasal congestion: Secondary | ICD-10-CM | POA: Diagnosis present

## 2017-03-02 HISTORY — DX: Unspecified glaucoma: H40.9

## 2017-03-02 MED ORDER — DEXAMETHASONE SODIUM PHOSPHATE 10 MG/ML IJ SOLN
10.0000 mg | Freq: Once | INTRAMUSCULAR | Status: AC
  Administered 2017-03-02: 10 mg via INTRAMUSCULAR
  Filled 2017-03-02: qty 1

## 2017-03-02 MED ORDER — FLUTICASONE PROPIONATE 50 MCG/ACT NA SUSP: 2.0000 | Freq: Every day | NASAL | 0 refills | Status: AC

## 2017-03-02 MED ORDER — BENZONATATE 100 MG PO CAPS: 100.0000 mg | ORAL_CAPSULE | Freq: Three times a day (TID) | ORAL | 0 refills | Status: AC

## 2017-03-02 MED FILL — BENZONATATE 100 MG CAPSULE: 100 | 7 days supply | Qty: 21 | Fill #0

## 2017-03-02 MED FILL — FLUTICASONE PROP 50 MCG SPR: 50 | 30 days supply | Qty: 16 | Fill #0

## 2017-03-02 NOTE — ED Provider Notes (Signed)
[facility] EMERGENCY DEPARTMENT Provider Note   CSN: [id number] Arrival date & time: 03/02/17  [id number]     History   Chief Complaint Chief Complaint  Patient presents with  . Cough    HPI  [name] is a 56 y.o. Female with history of hypertension and hyperlipidemia, presents complaining of nasal congestion and cough for 5 days.  Patient reports symptoms started with nasal congestion and a sore throat on Friday since then she has had progressively worsening drainage and congestion and on Sunday she started to have a hoarse scratchy voice with some laryngitis.  Patient denies any ear pain or drainage.  No shortness of breath or chest pain.  Patient denies fevers or chills, no nausea, vomiting, diarrhea or abdominal pain.  Patient has been using Corcidin which is helped minimally with symptoms.  Patient has not tried anything else for her symptoms.      Past Medical History:  Diagnosis Date  . Glaucoma   . High cholesterol   . Hypertension     There are no active problems to display for this patient.   Past Surgical History:  Procedure Laterality Date  . ABDOMINAL HYSTERECTOMY    . BREAST REDUCTION SURGERY    . CESAREAN SECTION      OB History    No data available       Home Medications    Prior to Admission medications   Medication Sig Start Date End Date Taking? Authorizing Provider  amLODipine (NORVASC) 10 MG tablet Take 10 mg by mouth daily.   Yes [provider]  rosuvastatin (CRESTOR) 10 MG tablet Take 10 mg by mouth daily.   Yes [provider]  UNKNOWN TO PATIENT    Yes [provider]  cyclobenzaprine (FLEXERIL) 10 MG tablet Take 1 tablet (10 mg total) by mouth 3 (three) times daily as needed for muscle spasms. 05/27/15   Nelva NayBeaton, Robert, MD    Family History No family history on file.  Social History Social History   Tobacco Use  . Smoking status: Never Smoker  . Smokeless tobacco: Never Used    Substance Use Topics  . Alcohol use: No  . Drug use: No     Allergies   Penicillins   Review of Systems Review of Systems  Constitutional: Negative for chills and fever.  HENT: Positive for congestion, postnasal drip, rhinorrhea and sore throat. Negative for ear discharge, ear pain, sinus pressure, trouble swallowing and voice change.   Eyes: Negative for discharge and redness.  Respiratory: Positive for cough. Negative for chest tightness and shortness of breath.   Cardiovascular: Negative for chest pain.  Gastrointestinal: Negative for abdominal pain, nausea and vomiting.  Skin: Negative for rash.     Physical Exam Updated Vital Signs BP (!) 152/98 (BP Location: Right Arm)   Pulse 99   Temp 99.2 F (37.3 C) (Oral)   Resp 18   Ht 5\' 6"  (1.676 m)   Wt 78.9 kg (174 lb)   SpO2 100%   BMI 28.08 kg/m   Physical Exam  Constitutional: She appears well-developed and well-nourished. No distress.  HENT:  Head: Normocephalic and atraumatic.  TMs clear with good landmarks, moderate nasal mucosa edema with clear rhinorrhea, posterior oropharynx clear and moist, with some erythema, and moderate tonsillar edema, no exudates, uvula midline, no trismus  Eyes: Right eye exhibits no discharge. Left eye exhibits no discharge.  Neck: Neck supple.  Cardiovascular: Normal rate, regular rhythm and normal heart sounds.  Pulmonary/Chest: Effort normal and breath sounds normal. No stridor. No respiratory distress. She has no wheezes. She has no rales.  Abdominal: Soft. Bowel sounds are normal. She exhibits no distension. There is no tenderness. There is no guarding.  Lymphadenopathy:    She has cervical adenopathy.  Neurological: She is alert. Coordination normal.  Skin: Skin is warm and dry. Capillary refill takes less than 2 seconds. She is not diaphoretic.  Psychiatric: She has a normal mood and affect. Her behavior is normal.  Nursing note and vitals reviewed.    ED Treatments /  Results  Labs (all labs ordered are listed, but only abnormal results are displayed) Labs Reviewed - No data to display  EKG  EKG Interpretation None       Radiology No results found.  Procedures Procedures (including critical care time)  Medications Ordered in ED Medications  dexamethasone (DECADRON) injection 10 mg (10 mg Intramuscular Given 03/02/17 0912)     Initial Impression / Assessment and Plan / ED Course  I have reviewed the triage vital signs and the nursing notes.  Pertinent labs & imaging results that were available during my care of the patient were reviewed by me and considered in my medical decision making (see chart for details).  Pt presents with nasal congestion and cough. Pt is well appearing and vitals are normal. Lungs CTA on exam, no concern for PNA. Pt does have moderate tonsillar edema, no exudates to suggest strep, will give decadron to reduce swelling, no concern for airway compromise. Patients symptoms are consistent with URI, likely viral etiology. Discussed that antibiotics are not indicated for viral infections. Pt will be discharged with symptomatic treatment.  Verbalizes understanding and is agreeable with plan. Pt is hemodynamically stable & in NAD prior to dc.   Final Clinical Impressions(s) / ED Diagnoses   Final diagnoses:  Viral URI with cough    ED Discharge Orders        Ordered    fluticasone (FLONASE) 50 MCG/ACT nasal spray  Daily     03/02/17 0913    benzonatate (TESSALON) 100 MG capsule  Every 8 hours     03/02/17 0913       Dartha LodgeFord, Mirza Fessel N, PA-C 03/02/17 1913    Dartha LodgeFord, Vaani Morren N, PA-C 03/02/17 1937    Abelino DerrickMackuen, Courteney Lyn, MD 03/04/17 1035

## 2017-03-02 NOTE — Discharge Instructions (Signed)
Your symptoms are likely caused by a viral upper respiratory infection.  Continue to use Corcidin, in addition use Tessalon Perles for cough and Flonase for congestion.  To help with cough use warm teas with honey, try and keep your throat bathed with liquids or throat lozenges.  If symptoms are not improving as the week continues please follow-up with your primary care provider.  If you develop chest pain shortness of breath or persistent fever chills please return to the ED for sooner evaluation.

## 2017-03-02 NOTE — ED Triage Notes (Signed)
Pt reports nasal congestion and drainage since this past Friday. Also present with laryngitis. Denies fever, n/v/d.

## 2017-03-02 NOTE — ED Notes (Signed)
ED Provider at bedside. 

## 2019-04-28 ENCOUNTER — Other Ambulatory Visit: Payer: Self-pay
